# Patient Record
Sex: Female | Born: 1950 | Race: Black or African American | Hispanic: No | Marital: Single | Smoking: Never smoker
Health system: Southern US, Community
[De-identification: ages and names within clinical notes are randomized; demographics above are authoritative.]

## PROBLEM LIST (undated history)

## (undated) DIAGNOSIS — I10 Essential (primary) hypertension: Secondary | ICD-10-CM

## (undated) DIAGNOSIS — Z955 Presence of coronary angioplasty implant and graft: Secondary | ICD-10-CM

## (undated) DIAGNOSIS — K5792 Diverticulitis of intestine, part unspecified, without perforation or abscess without bleeding: Secondary | ICD-10-CM

## (undated) DIAGNOSIS — E7800 Pure hypercholesterolemia, unspecified: Secondary | ICD-10-CM

## (undated) HISTORY — PX: CORONARY ANGIOPLASTY WITH STENT PLACEMENT: SHX49

---

## 2019-03-14 ENCOUNTER — Emergency Department: Payer: Medicare Other

## 2019-03-14 ENCOUNTER — Other Ambulatory Visit: Payer: Self-pay

## 2019-03-14 ENCOUNTER — Emergency Department
Admission: EM | Admit: 2019-03-14 | Discharge: 2019-03-14 | Disposition: A | Discharge status: Home / Self Care | Payer: Medicare Other | Attending: Emergency Medicine | Admitting: Emergency Medicine

## 2019-03-14 DIAGNOSIS — I10 Essential (primary) hypertension: Secondary | ICD-10-CM | POA: Diagnosis not present

## 2019-03-14 DIAGNOSIS — R079 Chest pain, unspecified: Secondary | ICD-10-CM | POA: Diagnosis present

## 2019-03-14 DIAGNOSIS — R109 Unspecified abdominal pain: Secondary | ICD-10-CM | POA: Insufficient documentation

## 2019-03-14 DIAGNOSIS — Z7982 Long term (current) use of aspirin: Secondary | ICD-10-CM | POA: Insufficient documentation

## 2019-03-14 HISTORY — DX: Diverticulitis of intestine, part unspecified, without perforation or abscess without bleeding: K57.92

## 2019-03-14 HISTORY — DX: Pure hypercholesterolemia, unspecified: E78.00

## 2019-03-14 HISTORY — DX: Presence of coronary angioplasty implant and graft: Z95.5

## 2019-03-14 HISTORY — DX: Essential (primary) hypertension: I10

## 2019-03-14 LAB — CBC
HCT: 39.3 % (ref 36.0–46.0)
Hemoglobin: 12.5 g/dL (ref 12.0–15.0)
MCH: 27.9 pg (ref 26.0–34.0)
MCHC: 31.8 g/dL (ref 30.0–36.0)
MCV: 87.7 fL (ref 80.0–100.0)
Platelets: 238 10*3/uL (ref 150–400)
RBC: 4.48 MIL/uL (ref 3.87–5.11)
RDW: 14.9 % (ref 11.5–15.5)
WBC: 12.3 10*3/uL — ABNORMAL HIGH (ref 4.0–10.5)
nRBC: 0 % (ref 0.0–0.2)

## 2019-03-14 LAB — LIPASE, BLOOD: Lipase: 39 U/L (ref 11–51)

## 2019-03-14 LAB — BASIC METABOLIC PANEL
Anion gap: 9 (ref 5–15)
BUN: 43 mg/dL — ABNORMAL HIGH (ref 8–23)
CO2: 25 mmol/L (ref 22–32)
Calcium: 9.4 mg/dL (ref 8.9–10.3)
Chloride: 107 mmol/L (ref 98–111)
Creatinine, Ser: 1.45 mg/dL — ABNORMAL HIGH (ref 0.44–1.00)
GFR calc Af Amer: 43 mL/min — ABNORMAL LOW (ref >60)
GFR calc non Af Amer: 37 mL/min — ABNORMAL LOW (ref >60)
Glucose, Bld: 136 mg/dL — ABNORMAL HIGH (ref 70–99)
Potassium: 3.4 mmol/L — ABNORMAL LOW (ref 3.5–5.1)
Sodium: 141 mmol/L (ref 135–145)

## 2019-03-14 LAB — HEPATIC FUNCTION PANEL
ALT: 41 U/L (ref 0–44)
AST: 44 U/L — ABNORMAL HIGH (ref 15–41)
Albumin: 4 g/dL (ref 3.5–5.0)
Alkaline Phosphatase: 112 U/L (ref 38–126)
Bilirubin, Direct: <0.1 mg/dL (ref 0.0–0.2)
Total Bilirubin: 0.6 mg/dL (ref 0.3–1.2)
Total Protein: 7.8 g/dL (ref 6.5–8.1)

## 2019-03-14 LAB — TROPONIN I
Troponin I: <0.03 ng/mL (ref <0.03)
Troponin I: <0.03 ng/mL (ref <0.03)

## 2019-03-14 IMAGING — CR CHEST - 2 VIEW
1 series · 2 of 2 positions shown · non-contrast
Comparison: None.

CLINICAL DATA: Chest pain.

EXAM:
CHEST - 2 VIEW

[Series 1: dg chest 2 view · 0.14mm/px · 2 of 2 slices shown]
[im 1/2]
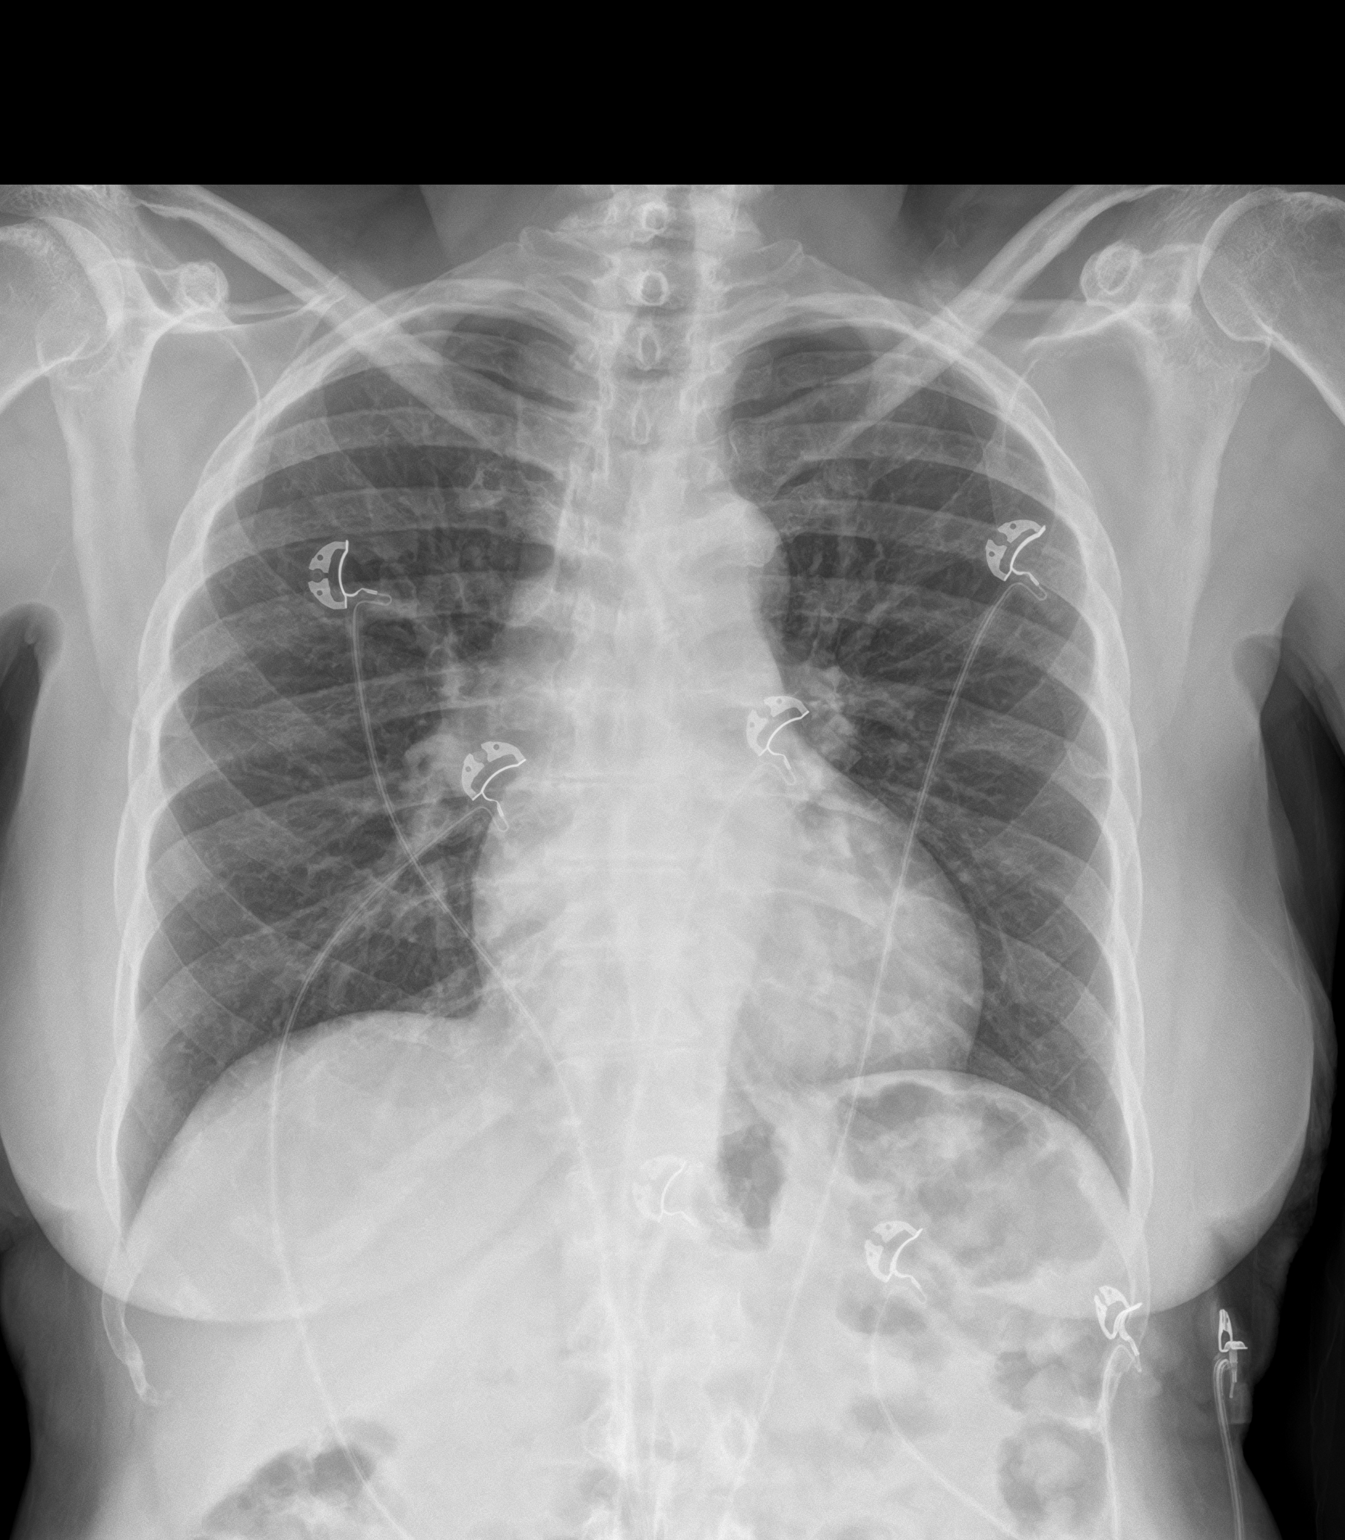
[im 2/2]
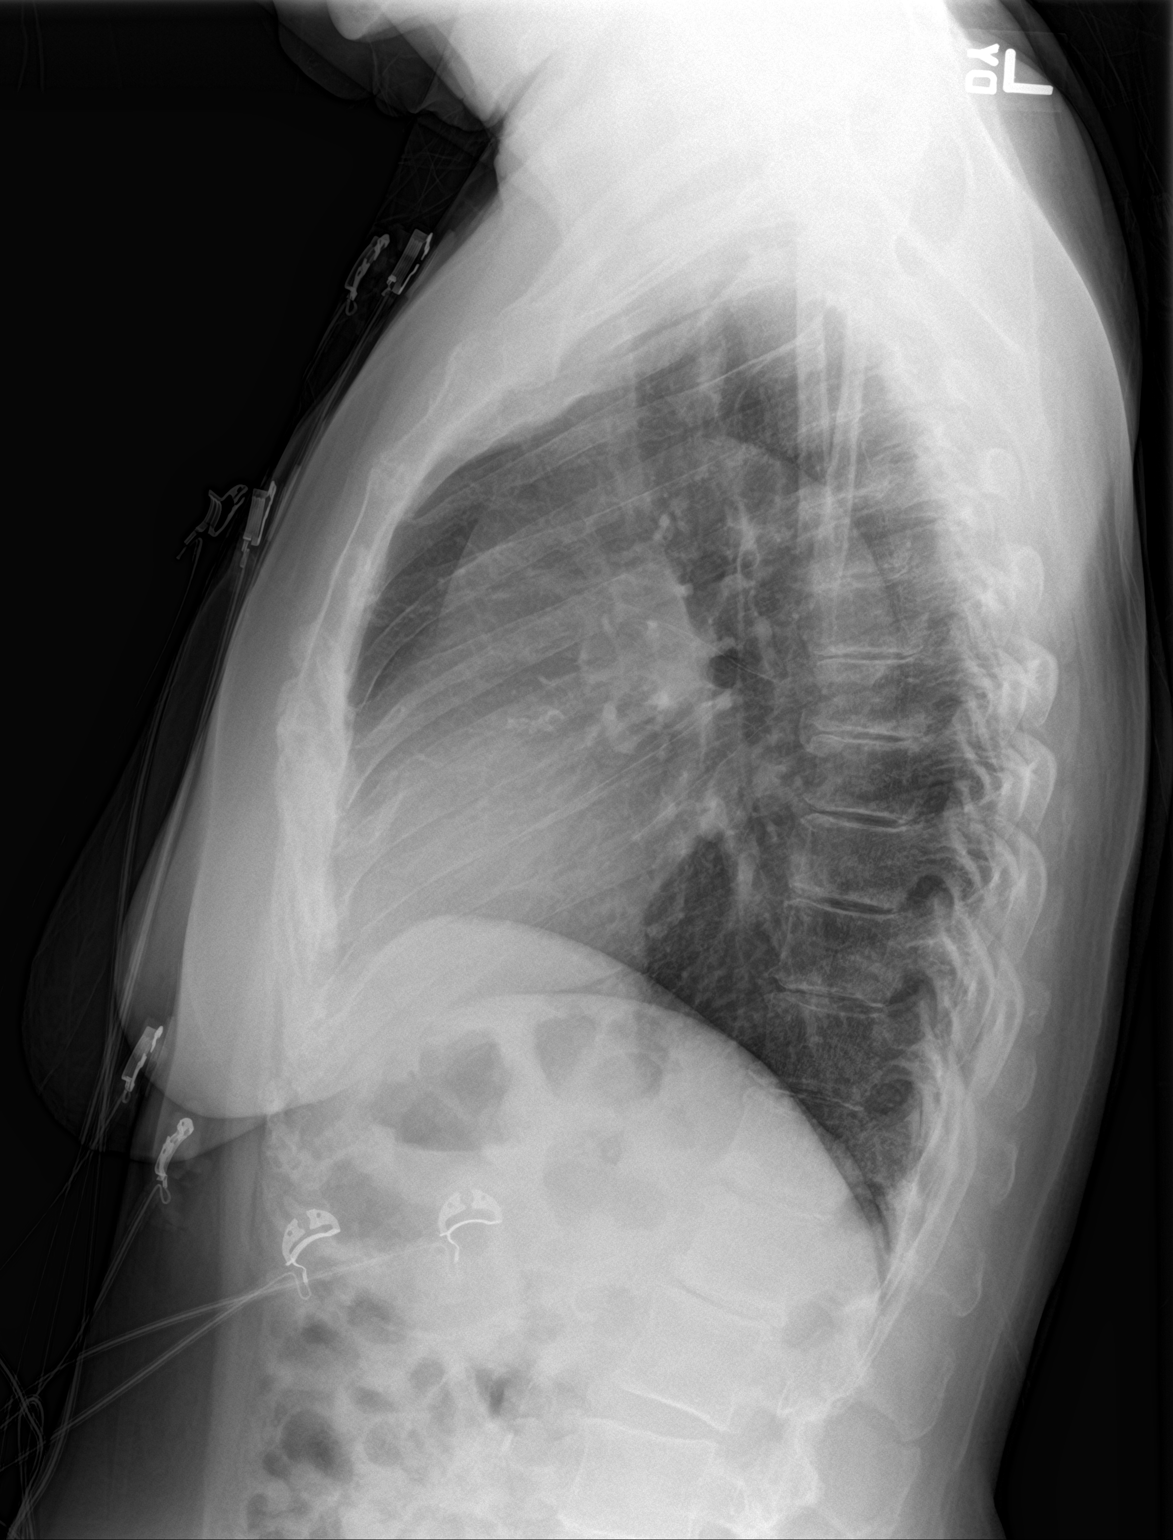

[2 of 2 positions shown; findings below may reference images not displayed]

FINDINGS: The heart size and mediastinal contours are within normal limits.
Both lungs are clear. No pneumothorax or pleural effusion is noted.
The visualized skeletal structures are unremarkable.
IMPRESSION: No active cardiopulmonary disease.

## 2019-03-14 MED ORDER — SODIUM CHLORIDE 0.9 % IV BOLUS
500.0000 mL | Freq: Once | INTRAVENOUS | Status: AC
  Administered 2019-03-14: 10:00:00 500 mL via INTRAVENOUS

## 2019-03-14 NOTE — ED Provider Notes (Signed)
Dr. Gwen Pounds reports he will have clinic follow up with patient and call for appointment on Monday.   Sharyn Creamer, MD 03/14/19 1314

## 2019-03-14 NOTE — ED Notes (Signed)
Pt ambulatory to toilet with steady gait noted.  

## 2019-03-14 NOTE — ED Notes (Signed)
US at bedside

## 2019-03-14 NOTE — ED Triage Notes (Signed)
Pt arrives to ED via [facility] from home for central CP x few weeks. States he has been heavy lifting and doing a lot of yard word. Denies SOB. Denies weight gain or leg swelling. A&O, ambulatory. No distress noted. Hx HTN, eleaveted cholesterol, hx stent placement. Took 4 baby ASA and 1 nitro at home.

## 2019-03-14 NOTE — ED Notes (Signed)
Pt ambulatory to toilet with steady gait.  

## 2019-03-14 NOTE — Discharge Instructions (Addendum)
Please call cardiology whether that is Dr. Philemon Kingdom office or [facility] cardiology on Monday morning to obtain a same-day appointment for follow-up for your chest pain.  You have been seen in the Emergency Department (ED) today for chest pain.  As we have discussed todays test results are normal, but you may require further testing.  Please follow up with the recommended doctor as instructed above in these documents regarding todays emergent visit and your recent symptoms to discuss further management.  Continue to take your regular medications. If you are not doing so already, please also take a daily baby aspirin (81 mg), at least until you follow up with your doctor.  Return to the Emergency Department (ED) if you experience any further chest pain/pressure/tightness, difficulty breathing, or sudden sweating, or other symptoms that concern you.

## 2019-03-14 NOTE — ED Notes (Signed)
Pt returned from xray

## 2019-03-14 NOTE — ED Notes (Signed)
Pt taken to xray via stretcher  

## 2019-03-14 NOTE — ED Notes (Signed)
EDP at bedside  

## 2019-03-14 NOTE — ED Provider Notes (Signed)
[facility] Emergency Department Provider Note   ____________________________________________   First MD Initiated Contact with Patient 03/14/19 [phone]     (approximate)  I have reviewed the triage vital signs and the nursing notes.   HISTORY  Chief Complaint Chest Pain    HPI Good Good is a 68 y.o. female  with a history of hypertension and hypercholesterolemia presents for evaluation of chest pain. Initial onset of pain was approximately 3-6 hours ago. The patient's chest pain is described as heaviness/pressure/tightness, is not worse with exertion and is relieved by nitroglycerin. The patient's chest pain is middle- or left-sided, is not well-localized, is not sharp and does not radiate to the arms/jaw/neck. The patient does not complain of nausea and denies diaphoresis. The patient has no history of stroke, has no history of peripheral artery disease, has not smoked in the past 90 days, denies any history of treated diabetes, has no relevant family history of coronary artery disease (first degree relative at less than age 9) and does not have an elevated BMI (>=30).     Patient reports of pain was first noticed about 2 weeks ago while gardening.  His over become intermittent.  It was fairly severe at about 2 AM last night, started while she is getting ready for bed.  It caused significant pain, a dull achiness but some slight sharp component as well.  She reports that it subsided after taking aspirin 325 mg and one nitroglycerin tablet she had at home before EMS arrived.  Reports the present time the pain is gone.  She denies any abdominal pain.  However she has noticed at times it feels like the pain seems slightly worse after she eats something, but she not having the pain now.  She still eating and drinking normally and denies abdominal pain.  No nausea or vomiting.  No sweating.  Receives [facility] care at [facility]  Past Medical History:  Diagnosis  Date  . Diverticulitis   . H/O right coronary artery stent placement   . High cholesterol   . Hypertension     There are no active problems to display for this patient.   History reviewed. No pertinent surgical history.  Prior to Admission medications   Medication Sig Start Date End Date Taking? Authorizing Provider  acetaminophen (TYLENOL) 500 MG tablet Take 1,000 mg by mouth every 6 (six) hours as needed. 07/18/17  Yes [provider]  aspirin EC 81 MG tablet Take 81 mg by mouth daily. 02/15/17  Yes [provider]  atenolol-chlorthalidone (TENORETIC) 100-25 MG tablet Take 1 tablet by mouth daily. 03/09/19  Yes [provider]  atorvastatin (LIPITOR) 40 MG tablet Take 40 mg by mouth every evening. 03/12/19  Yes [provider]  enalapril (VASOTEC) 20 MG tablet Take 20 mg by mouth 2 (two) times a day. 03/09/19  Yes [provider]  nitroGLYCERIN (NITROSTAT) 0.4 MG SL tablet Place 0.4 mg under the tongue as needed. 03/15/15  Yes [provider]    Allergies Metformin  History reviewed. No pertinent family history.  Social History Social History   Tobacco Use  . Smoking status: Never Smoker  Substance Use Topics  . Alcohol use: Not Currently  . Drug use: Not on file    Review of Systems Constitutional: No fever/chills Eyes: No visual changes. ENT: No sore throat. Cardiovascular: See HPI Respiratory: Denies shortness of breath. Gastrointestinal: No abdominal pain.  Sometimes the pain feels worse after eating. Genitourinary: Negative  for dysuria. Musculoskeletal: Negative for back pain. Skin: Negative for rash. Neurological: Negative for headaches, areas of focal weakness or numbness.    ____________________________________________   PHYSICAL EXAM:  VITAL SIGNS: ED Triage Vitals  Enc Vitals Group     BP 03/14/19 0741 134/66     Pulse --      Resp 03/14/19 0741 14     Temp --      Temp Source 03/14/19 0741 Oral      SpO2 --      Weight 03/14/19 0738 135 lb (61.2 kg)     Height 03/14/19 0738 5\' 9"  (1.753 m)     Head Circumference --      Peak Flow --      Pain Score 03/14/19 0738 6     Pain Loc --      Pain Edu? --      Excl. in GC? --     Constitutional: Alert and oriented. Well appearing and in no acute distress.  She is very pleasant. Eyes: Conjunctivae are normal. Head: Atraumatic. Nose: No congestion/rhinnorhea. Mouth/Throat: Mucous membranes are moist. Neck: No stridor.  Cardiovascular: Normal rate, regular rhythm. Grossly normal heart sounds.  Good peripheral circulation. Respiratory: Normal respiratory effort.  No retractions. Lungs CTAB. Gastrointestinal: Soft and nontender. No distention. Musculoskeletal: No lower extremity tenderness nor edema. Neurologic:  Normal speech and language. No gross focal neurologic deficits are appreciated.  Skin:  Skin is warm, dry and intact. No rash noted. Psychiatric: Mood and affect are normal. Speech and behavior are normal.  ____________________________________________   LABS (all labs ordered are listed, but only abnormal results are displayed)  Labs Reviewed  BASIC METABOLIC PANEL - Abnormal; Notable for the following components:      Result Value   Potassium 3.4 (*)    Glucose, Bld 136 (*)    BUN 43 (*)    Creatinine, Ser 1.45 (*)    GFR calc non Af Amer 37 (*)    GFR calc Af Amer 43 (*)    All other components within normal limits  CBC - Abnormal; Notable for the following components:   WBC 12.3 (*)    All other components within normal limits  HEPATIC FUNCTION PANEL - Abnormal; Notable for the following components:   AST 44 (*)    All other components within normal limits  TROPONIN I  LIPASE, BLOOD  TROPONIN I   ____________________________________________  EKG  Reviewed and interpreted by me at 7:40 AM There is no prior EKG for easy comparison  in our system Heart rate 50 QRS 90 QTc 460 Normal sinus rhythm, left  ventricular hypertrophy suspected, fairly deep T wave inversions laterally and also some both the precordial and limb leads.  Difficult to exclude ischemia , of note the patient is pain-free at the time of this EKG.  Repeat EKG performed, patient remains pain-free no acute changes. ____________________________________________  RADIOLOGY  Dg Chest 2 View  Result Date: 03/14/2019 CLINICAL DATA:  Chest pain. EXAM: CHEST - 2 VIEW COMPARISON:  None. FINDINGS: The heart size and mediastinal contours are within normal limits. Both lungs are clear. No pneumothorax or pleural effusion is noted. The visualized skeletal structures are unremarkable. IMPRESSION: No active cardiopulmonary disease. Electronically Signed   By: Lupita RaiderJames  Green Jr M.D.   On: 03/14/2019 08:23   [place] Abdomen Limited Ruq  Result Date: 03/14/2019 CLINICAL DATA:  Right upper quadrant abdominal pain for several weeks. EXAM: ULTRASOUND ABDOMEN LIMITED RIGHT UPPER QUADRANT COMPARISON:  None. FINDINGS: Gallbladder: No gallstones or wall thickening visualized. No sonographic Murphy sign noted by sonographer. Common bile duct: Diameter: 3 mm Liver: No focal lesion identified. Within normal limits in parenchymal echogenicity. Portal vein is patent on color Doppler imaging with normal direction of blood flow towards the liver. IMPRESSION: 1. Unremarkable sonographic appearance of the hepatobiliary system. Electronically Signed   By: Gaylyn Rong M.D.   On: 03/14/2019 10:08   Chest x-ray reviewed as well as right upper quadrant also both negative for pathology ____________________________________________   PROCEDURES  Procedure(s) performed: None  Procedures  Critical Care performed: No  ____________________________________________   INITIAL IMPRESSION / ASSESSMENT AND PLAN / ED COURSE  Pertinent labs & imaging results that were available during my care of the patient were reviewed by me and considered in my medical decision making  (see chart for details).   Differential diagnosis includes, but is not limited to, ACS, aortic dissection, pulmonary embolism, cardiac tamponade, pneumothorax, pneumonia, pericarditis, myocarditis, GI-related causes including esophagitis/gastritis, and musculoskeletal chest wall pain.  Patient's heart score before troponin is elevated moderately at 6.  Her history is mixed between some concerning and less concerning components, and the length of symptoms intermittently over a couple weeks time worsening does concern me as well.  She is currently pain-free.  I will observe her closely, check a troponin.  Also plan to discuss with [facility] cardiology as we do not have previous EKG for comparison to obtain information from her cardiology team as to their recommendations for care once initial testing is back.   LARESA OSHIRO was evaluated in Emergency Department on 03/14/2019 for the symptoms described in the history of present illness. She was evaluated in the context of the global COVID-19 pandemic, which necessitated consideration that the patient might be at risk for infection with the SARS-CoV-2 virus that causes COVID-19. Institutional protocols and algorithms that pertain to the evaluation of patients at risk for COVID-19 are in a state of rapid change based on information released by regulatory bodies including the CDC and federal and state organizations. These policies and algorithms were followed during the patient's care in the ED.  Patient symptoms do not appear to be consistent with coronavirus disease.  She denies any fevers or recent illness.  She has been sheltering at home.  She has no flulike or infectious symptoms denoted.   Clinical Course as of Mar 13 1236  Sat Mar 14, 2019  0804 No tachycardia, no hypoxia.  The patient denies any history of ripping tearing or moving pain.  She is not notably hypertensive.  She denies any history of leg swelling, no recent surgeries, no blood clots.  She  reports the symptoms remind her somewhat of her symptoms she was talked to by her doctor about her stents in the past, believes that those were placed in about 2006.   [MQ]  [phone] Patient remains asymptomatic.  Creatinine slightly elevated, baseline not quite clear.  Have paged [facility] cardiology who the patient identifies as her primary cardiologist at this time to discuss her EKG, presentation and symptoms.   [MQ]  1003 [facility] cardiology was not available for consultation today due to coronavirus.  I have paged unassigned cardiology to discuss.   [MQ]  1031 Case and care discussed with Dr. Gwen Pounds. EKGs, clinical history reviewed.    [MQ]  1040 Had a extensive conversation with the patient, also spoke with her daughter Hilbert Odor via phone, and we discussed that she does have some concerning chest  pain and that my recommendation would be for an hospital observation although alternatively with her concerns about coronavirus it would not be unreasonable for her to go home if she had a plan to stay with her daughter which she is, and to call 911 right away if she had concerns like shortness of breath or recurrent chest pain.  Cardiology advises they can follow-up with her and see her Monday and she is agreeable with this plan to.  With shared medical decision making, discussion of her risk for being moderate for possibly having a heart attack in the next few days, and the patient would like for [place] to check her second blood test and if this is normal as she is no longer having pain she would like to likely go home and follow-up Monday.   [MQ]    Clinical Course User Index [MQ] Sharyn Creamer, MD   ----------------------------------------- 12:36 PM on 03/14/2019 -----------------------------------------   Patient remains pain-free.  She wishes to be discharged and I have talked to cardiology and she will follow-up Monday with either our cardiology team or hers at [facility].  Vitals:   03/14/19 0900 03/14/19 1100   BP: 132/67 135/67  Pulse: (!) 56 (!) 50  Resp: 20 14  Temp:    SpO2: 99% 100%   Cardiology had advised consideration of beta-blocker at discharge, but she does have some slight bradycardia and we will avoid beta blockade at this point and rather have her follow-up Monday with cardiology which she is certainly agreeable to do. ____________________________________________   FINAL CLINICAL IMPRESSION(S) / ED DIAGNOSES  Final diagnoses:  Abdominal pain  Chest pain, moderate coronary artery risk        Note:  This document was prepared using Dragon voice recognition software and may include unintentional dictation errors       Sharyn Creamer, MD 03/14/19 1237

## 2019-03-14 NOTE — ED Notes (Signed)
Daughter- Jamesetta Pietri [phone]

## 2021-12-22 ENCOUNTER — Encounter: Payer: Self-pay | Admitting: Emergency Medicine

## 2021-12-22 ENCOUNTER — Emergency Department: Payer: Medicare PPO

## 2021-12-22 ENCOUNTER — Telehealth: Payer: Self-pay | Admitting: Emergency Medicine

## 2021-12-22 ENCOUNTER — Other Ambulatory Visit: Payer: Self-pay

## 2021-12-22 ENCOUNTER — Emergency Department
Admission: EM | Admit: 2021-12-22 | Discharge: 2021-12-22 | Disposition: A | Discharge status: Home / Self Care | Payer: Medicare PPO | Attending: Emergency Medicine | Admitting: Emergency Medicine

## 2021-12-22 DIAGNOSIS — K5792 Diverticulitis of intestine, part unspecified, without perforation or abscess without bleeding: Secondary | ICD-10-CM | POA: Insufficient documentation

## 2021-12-22 DIAGNOSIS — Z7982 Long term (current) use of aspirin: Secondary | ICD-10-CM | POA: Diagnosis not present

## 2021-12-22 DIAGNOSIS — K529 Noninfective gastroenteritis and colitis, unspecified: Secondary | ICD-10-CM | POA: Diagnosis not present

## 2021-12-22 DIAGNOSIS — Z79899 Other long term (current) drug therapy: Secondary | ICD-10-CM | POA: Insufficient documentation

## 2021-12-22 DIAGNOSIS — D72829 Elevated white blood cell count, unspecified: Secondary | ICD-10-CM | POA: Insufficient documentation

## 2021-12-22 DIAGNOSIS — Z20822 Contact with and (suspected) exposure to covid-19: Secondary | ICD-10-CM | POA: Insufficient documentation

## 2021-12-22 DIAGNOSIS — I2510 Atherosclerotic heart disease of native coronary artery without angina pectoris: Secondary | ICD-10-CM | POA: Insufficient documentation

## 2021-12-22 DIAGNOSIS — I10 Essential (primary) hypertension: Secondary | ICD-10-CM | POA: Diagnosis not present

## 2021-12-22 DIAGNOSIS — R109 Unspecified abdominal pain: Secondary | ICD-10-CM | POA: Diagnosis present

## 2021-12-22 LAB — COMPREHENSIVE METABOLIC PANEL
ALT: 40 U/L (ref 0–44)
AST: 40 U/L (ref 15–41)
Albumin: 3.6 g/dL (ref 3.5–5.0)
Alkaline Phosphatase: 108 U/L (ref 38–126)
Anion gap: 6 (ref 5–15)
BUN: 32 mg/dL — ABNORMAL HIGH (ref 8–23)
CO2: 25 mmol/L (ref 22–32)
Calcium: 9.1 mg/dL (ref 8.9–10.3)
Chloride: 107 mmol/L (ref 98–111)
Creatinine, Ser: 1.5 mg/dL — ABNORMAL HIGH (ref 0.44–1.00)
GFR, Estimated: 37 mL/min — ABNORMAL LOW (ref >60)
Glucose, Bld: 144 mg/dL — ABNORMAL HIGH (ref 70–99)
Potassium: 3.4 mmol/L — ABNORMAL LOW (ref 3.5–5.1)
Sodium: 138 mmol/L (ref 135–145)
Total Bilirubin: 0.6 mg/dL (ref 0.3–1.2)
Total Protein: 7.6 g/dL (ref 6.5–8.1)

## 2021-12-22 LAB — URINALYSIS, ROUTINE W REFLEX MICROSCOPIC
Bacteria, UA: NONE SEEN
Bilirubin Urine: NEGATIVE
Glucose, UA: NEGATIVE mg/dL
Ketones, ur: NEGATIVE mg/dL
Leukocytes,Ua: NEGATIVE
Nitrite: NEGATIVE
Protein, ur: NEGATIVE mg/dL
Specific Gravity, Urine: 1.03 (ref 1.005–1.030)
pH: 5 (ref 5.0–8.0)

## 2021-12-22 LAB — CBC WITH DIFFERENTIAL/PLATELET
Abs Immature Granulocytes: 0.06 10*3/uL (ref 0.00–0.07)
Basophils Absolute: 0.1 10*3/uL (ref 0.0–0.1)
Basophils Relative: 0 %
Eosinophils Absolute: 0.5 10*3/uL (ref 0.0–0.5)
Eosinophils Relative: 3 %
HCT: 40.5 % (ref 36.0–46.0)
Hemoglobin: 12.6 g/dL (ref 12.0–15.0)
Immature Granulocytes: 0 %
Lymphocytes Relative: 12 %
Lymphs Abs: 1.9 10*3/uL (ref 0.7–4.0)
MCH: 27.4 pg (ref 26.0–34.0)
MCHC: 31.1 g/dL (ref 30.0–36.0)
MCV: 88 fL (ref 80.0–100.0)
Monocytes Absolute: 1.3 10*3/uL — ABNORMAL HIGH (ref 0.1–1.0)
Monocytes Relative: 8 %
Neutro Abs: 12 10*3/uL — ABNORMAL HIGH (ref 1.7–7.7)
Neutrophils Relative %: 77 %
Platelets: 270 10*3/uL (ref 150–400)
RBC: 4.6 MIL/uL (ref 3.87–5.11)
RDW: 15 % (ref 11.5–15.5)
WBC: 15.8 10*3/uL — ABNORMAL HIGH (ref 4.0–10.5)
nRBC: 0 % (ref 0.0–0.2)

## 2021-12-22 LAB — RESP PANEL BY RT-PCR (FLU A&B, COVID) ARPGX2
Influenza A by PCR: NEGATIVE
Influenza B by PCR: NEGATIVE
SARS Coronavirus 2 by RT PCR: NEGATIVE

## 2021-12-22 LAB — TROPONIN I (HIGH SENSITIVITY)
Troponin I (High Sensitivity): 12 ng/L (ref <18)
Troponin I (High Sensitivity): 11 ng/L (ref <18)

## 2021-12-22 LAB — LIPASE, BLOOD: Lipase: 44 U/L (ref 11–51)

## 2021-12-22 MED ORDER — IOHEXOL 300 MG/ML  SOLN
75.0000 mL | Freq: Once | INTRAMUSCULAR | Status: AC | PRN
  Administered 2021-12-22: 75 mL via INTRAVENOUS

## 2021-12-22 MED ORDER — ONDANSETRON HCL 4 MG PO TABS: 4.0000 mg | ORAL_TABLET | Freq: Every day | ORAL | 0 refills | Status: AC | PRN

## 2021-12-22 MED ORDER — SODIUM CHLORIDE 0.9 % IV SOLN: INTRAVENOUS | Status: DC

## 2021-12-22 MED ORDER — HYDROCODONE-ACETAMINOPHEN 5-325 MG PO TABS: 1.0000 | ORAL_TABLET | Freq: Four times a day (QID) | ORAL | 0 refills | Status: AC | PRN

## 2021-12-22 MED ORDER — ONDANSETRON HCL 4 MG/2ML IJ SOLN
4.0000 mg | Freq: Once | INTRAMUSCULAR | Status: AC
  Administered 2021-12-22: 4 mg via INTRAVENOUS
  Filled 2021-12-22: qty 2

## 2021-12-22 MED ORDER — AMOXICILLIN-POT CLAVULANATE 875-125 MG PO TABS
1.0000 | ORAL_TABLET | Freq: Once | ORAL | Status: AC
  Administered 2021-12-22: 1 via ORAL
  Filled 2021-12-22: qty 1

## 2021-12-22 MED ORDER — HYDROCODONE-ACETAMINOPHEN 5-325 MG PO TABS
1.0000 | ORAL_TABLET | ORAL | 0 refills | Status: AC | PRN
Start: 2021-12-22 — End: 2022-12-22

## 2021-12-22 MED ORDER — MORPHINE SULFATE (PF) 4 MG/ML IV SOLN
4.0000 mg | Freq: Once | INTRAVENOUS | Status: AC
  Administered 2021-12-22: 4 mg via INTRAVENOUS
  Filled 2021-12-22: qty 1

## 2021-12-22 MED ORDER — ONDANSETRON 4 MG PO TBDP: 4.0000 mg | ORAL_TABLET | Freq: Four times a day (QID) | ORAL | 0 refills | Status: AC | PRN

## 2021-12-22 NOTE — Discharge Instructions (Addendum)

## 2021-12-22 NOTE — ED Provider Notes (Signed)
[facility] Provider Note    Event Date/Time   First MD Initiated Contact with Patient 12/22/21 [phone]     (approximate)   History   Abdominal Pain   HPI  Dawn Good is a 71 y.o. female with history of CAD, hypertension, hyperlipidemia, diverticulosis and recurrent diverticulitis who presents to the emergency department with abdominal pain for the past week and a half that worsened tonight with nausea and vomiting.  No fevers but has had chills.  No diarrhea, bloody stools or melena.  No dysuria, hematuria, vaginal bleeding or discharge.  States she did have a previous abdominal surgery when she was 71 years old but is not able to tell me what was done.  She states this feels like her diverticulitis but normally her diverticulitis causes left lower quadrant pain and today she is having epigastric pain and right lower quadrant pain.  She denies history of appendectomy, cholecystectomy.   History provided by patient.    Past Medical History:  Diagnosis Date   Diverticulitis    H/O right coronary artery stent placement    High cholesterol    Hypertension     Past Surgical History:  Procedure Laterality Date   CORONARY ANGIOPLASTY WITH STENT PLACEMENT      MEDICATIONS:  Prior to Admission medications   Medication Sig Start Date End Date Taking? Authorizing Provider  acetaminophen (TYLENOL) 500 MG tablet Take 1,000 mg by mouth every 6 (six) hours as needed. 07/18/17   [provider]  aspirin EC 81 MG tablet Take 81 mg by mouth daily. 02/15/17   [provider]  atenolol-chlorthalidone (TENORETIC) 100-25 MG tablet Take 1 tablet by mouth daily. 03/09/19   [provider]  atorvastatin (LIPITOR) 40 MG tablet Take 40 mg by mouth every evening. 03/12/19   [provider]  enalapril (VASOTEC) 20 MG tablet Take 20 mg by mouth 2 (two) times a day. 03/09/19   [provider]  nitroGLYCERIN (NITROSTAT) 0.4 MG SL tablet  Place 0.4 mg under the tongue as needed. 03/15/15   [provider]    Physical Exam   Triage Vital Signs: ED Triage Vitals  Enc Vitals Group     BP 12/22/21 0029 (!) 156/70     Pulse Rate 12/22/21 0029 65     Resp 12/22/21 0029 20     Temp 12/22/21 0029 99.1 F (37.3 C)     Temp Source 12/22/21 0029 Oral     SpO2 12/22/21 0029 99 %     Weight 12/22/21 0019 130 lb (59 kg)     Height 12/22/21 0019 5\' 6"  (1.676 m)     Head Circumference --      Peak Flow --      Pain Score 12/22/21 0018 9     Pain Loc --      Pain Edu? --      Excl. in [place]? --     Most recent vital signs: Vitals:   12/22/21 0515 12/22/21 0530  BP:  (!) 108/59  Pulse: 61 64  Resp: 18 (!) 23  Temp:  98.9 F (37.2 C)  SpO2: 99% 99%    CONSTITUTIONAL: Alert and oriented and responds appropriately to questions. Well-appearing; well-nourished HEAD: Normocephalic, atraumatic EYES: Conjunctivae clear, pupils appear equal, sclera nonicteric ENT: normal nose; moist mucous membranes NECK: Supple, normal ROM CARD: RRR; S1 and S2 appreciated; no murmurs, no clicks, no rubs, no gallops RESP: Normal chest excursion without splinting or tachypnea;  breath sounds clear and equal bilaterally; no wheezes, no rhonchi, no rales, no hypoxia or respiratory distress, speaking full sentences ABD/GI: Normal bowel sounds; non-distended; soft, tender to palpation diffusely but worse in the right lower quadrant with voluntary guarding, no rebound BACK: The back appears normal EXT: Normal ROM in all joints; no deformity noted, no edema; no cyanosis SKIN: Normal color for age and race; warm; no rash on exposed skin NEURO: Moves all extremities equally, normal speech PSYCH: The patient's mood and manner are appropriate.   ED Results / Procedures / Treatments   LABS: (all labs ordered are listed, but only abnormal results are displayed) Labs Reviewed  CBC WITH DIFFERENTIAL/PLATELET - Abnormal; Notable for the following  components:      Result Value   WBC 15.8 (*)    Neutro Abs 12.0 (*)    Monocytes Absolute 1.3 (*)    All other components within normal limits  COMPREHENSIVE METABOLIC PANEL - Abnormal; Notable for the following components:   Potassium 3.4 (*)    Glucose, Bld 144 (*)    BUN 32 (*)    Creatinine, Ser 1.50 (*)    GFR, Estimated 37 (*)    All other components within normal limits  URINALYSIS, ROUTINE W REFLEX MICROSCOPIC - Abnormal; Notable for the following components:   Color, Urine YELLOW (*)    APPearance CLEAR (*)    Hgb urine dipstick MODERATE (*)    All other components within normal limits  RESP PANEL BY RT-PCR (FLU A&B, COVID) ARPGX2  LIPASE, BLOOD  TROPONIN I (HIGH SENSITIVITY)  TROPONIN I (HIGH SENSITIVITY)     EKG:  EKG Interpretation  Date/Time:  Friday December 22 2021 00:25:09 EST Ventricular Rate:  68 PR Interval:  160 QRS Duration: 80 QT Interval:  424 QTC Calculation: 450 R Axis:   61 Text Interpretation: Normal sinus rhythm Marked ST abnormality, possible inferior subendocardial injury Abnormal ECG No significant change since last tracing in 2020 Confirmed by Konner Warrior, Baxter Hire [phone]) on 12/22/2021 3:42:27 AM         RADIOLOGY: My personal review and interpretation of imaging: CT scan shows normal appendix.  Findings of enteritis and right diverticulitis present.  I have personally reviewed all radiology reports.   CT ABDOMEN PELVIS W CONTRAST  Result Date: 12/22/2021 CLINICAL DATA:  Right lower quadrant abdominal pain EXAM: CT ABDOMEN AND PELVIS WITH CONTRAST TECHNIQUE: Multidetector CT imaging of the abdomen and pelvis was performed using the standard protocol following bolus administration of intravenous contrast. RADIATION DOSE REDUCTION: This exam was performed according to the departmental dose-optimization program which includes automated exposure control, adjustment of the mA and/or kV according to patient size and/or use of iterative reconstruction  technique. CONTRAST:  66mL OMNIPAQUE IOHEXOL 300 MG/ML  SOLN COMPARISON:  None. FINDINGS: Lower chest: Lung bases are clear. Hepatobiliary: Subcentimeter hepatic cysts. Gallbladder is unremarkable. No intrahepatic or extrahepatic ductal dilatation. Pancreas: Within normal limits. Spleen: Within normal limits. Adrenals/Urinary Tract: Adrenal glands are within normal limits. 0.0 cm cyst in the left lower kidney (series 7/image 14), benign. Right kidney is within normal limits. No hydronephrosis. Bladder is partially obscured by streak artifact but grossly unremarkable. Stomach/Bowel: Stomach is notable for a tiny hiatal hernia. no evidence of bowel obstruction. Large duodenal diverticuli in the left mid abdomen (coronal image 24). Normal appendix (series 2/image 42). Scattered colonic diverticulosis with wall thickening involving a loop of distal ileum (coronal image 63) and trace fluid in the right pericolic gutter (series 2/image 50). Adjacent  scattered right colonic diverticulosis. This is poorly evaluated given lack of intraluminal contrast but favors infectious/inflammatory enteritis over right colonic diverticulitis. Additional sigmoid diverticulosis, without associated inflammatory changes. Vascular/Lymphatic: No evidence of abdominal aortic aneurysm. Atherosclerotic calcifications of the abdominal aorta and branch vessels. No suspicious abdominopelvic lymphadenopathy. Reproductive: Status post hysterectomy. No adnexal masses. Other: Small volume pelvic ascites. No free air. Musculoskeletal: Right hip arthroplasty, without evidence of complication. Mild degenerative changes of the visualized thoracolumbar spine. IMPRESSION: No evidence of bowel obstruction.  Normal appendix. Inflammatory changes/fluid in the ileocecal region, favoring infectious/inflammatory enteritis, less likely right colonic diverticulitis. Small volume pelvic ascites. No free air. Additional ancillary findings as above. Electronically  Signed   By: Julian Hy M.D.   On: 12/22/2021 03:41     PROCEDURES:  Critical Care performed: No   CRITICAL CARE Performed by: Cyril Mourning Christain Mcraney   Total critical care time: 0 minutes  Critical care time was exclusive of separately billable procedures and treating other patients.  Critical care was necessary to treat or prevent imminent or life-threatening deterioration.  Critical care was time spent personally by me on the following activities: development of treatment plan with patient and/or surrogate as well as nursing, discussions with consultants, evaluation of patient's response to treatment, examination of patient, obtaining history from patient or surrogate, ordering and performing treatments and interventions, ordering and review of laboratory studies, ordering and review of radiographic studies, pulse oximetry and re-evaluation of patient's condition.   Marland Kitchen1-3 Lead EKG Interpretation Performed by: Shimon Trowbridge, Delice Bison, DO Authorized by: Keyah Blizard, Delice Bison, DO     Interpretation: normal     ECG rate:  70   ECG rate assessment: normal     Rhythm: sinus rhythm     Ectopy: none     Conduction: normal      IMPRESSION / MDM / ASSESSMENT AND PLAN / ED COURSE  I reviewed the triage vital signs and the nursing notes.    Patient here with complaints of abdominal pain and vomiting.  Pain worsening over the past week and a half.  The patient is on the cardiac monitor to evaluate for evidence of arrhythmia and/or significant heart rate changes.   DIFFERENTIAL DIAGNOSIS (includes but not limited to):   Appendicitis, cholecystitis, biliary colic, pancreatitis, cholangitis, choledocholithiasis, diverticulitis, colitis, bowel obstruction, UTI, kidney stone, pyelonephritis, viral gastroenteritis   PLAN: We will obtain CBC, CMP, lipase, urine, CT of the abdomen pelvis.  EKG was obtained from triage given she did have some epigastric pain but shows no new ischemic abnormality.  Troponin  is also pending.  She denies chest pain or shortness of breath today.   MEDICATIONS GIVEN IN ED: Medications  0.9 %  sodium chloride infusion (0 mLs Intravenous Stopped 12/22/21 0527)  morphine (PF) 4 MG/ML injection 4 mg (4 mg Intravenous Given 12/22/21 0257)  ondansetron (ZOFRAN) injection 4 mg (4 mg Intravenous Given 12/22/21 0257)  iohexol (OMNIPAQUE) 300 MG/ML solution 75 mL (75 mLs Intravenous Contrast Given 12/22/21 0323)  amoxicillin-clavulanate (AUGMENTIN) 875-125 MG per tablet 1 tablet (1 tablet Oral Given 12/22/21 0356)     ED COURSE: Labs show leukocytosis of 15.8 with left shift.  She has slightly elevated kidney function which appears stable compared to previous.  Normal LFTs, lipase.  Troponin x2 negative.  CT of the abdomen pelvis reviewed by myself and radiologist shows enteritis, possible right colonic diverticulitis without complication.  No appendicitis seen.  Urine shows no sign of infection.  Patient reports feeling better and is  tolerating p.o.  Will discharge with Augmentin and pain and nausea medicine.  She is comfortable with this plan.  Discussed supportive care instructions, eating a bland diet and return precautions.  Patient comfortable with plan for discharge home.  At this time, I do not feel there is any life-threatening condition present. I reviewed all nursing notes, vitals, pertinent previous records.  All lab and urine results, EKGs, imaging ordered have been independently reviewed and interpreted by myself.  I reviewed all available radiology reports from any imaging ordered this visit.  Based on my assessment, I feel the patient is safe to be discharged home without further emergent workup and can continue workup as an outpatient as needed. Discussed all findings, treatment plan as well as usual and customary return precautions with patient.  They verbalize understanding and are comfortable with this plan.  Outpatient follow-up has been provided as needed.  All  questions have been answered.    CONSULTS: Admission was considered given patient's age and history of diverticulitis but given no complications seen on CT imaging and that she was hemodynamically stable without signs of sepsis and was tolerating p.o., decision was made to discharge home with oral medications and close outpatient follow-up.   OUTSIDE RECORDS REVIEWED: Reviewed patient's last internal medicine note at [facility] on 05/17/2021.         FINAL CLINICAL IMPRESSION(S) / ED DIAGNOSES   Final diagnoses:  Enteritis  Diverticulitis     Rx / DC Orders   ED Discharge Orders          Ordered    ondansetron (ZOFRAN-ODT) 4 MG disintegrating tablet  Every 6 hours PRN        12/22/21 0516    HYDROcodone-acetaminophen (NORCO/VICODIN) 5-325 MG tablet  Every 6 hours PRN        12/22/21 0516             Note:  This document was prepared using Dragon voice recognition software and may include unintentional dictation errors.   Iolani Twilley, Delice Bison, DO 12/22/21 [phone]

## 2021-12-22 NOTE — ED Notes (Signed)
Pt given water for PO challenge 

## 2021-12-22 NOTE — ED Triage Notes (Signed)
Patient ambulatory to triage with steady gait, without difficulty or distress noted; pt reports mid upper abd pain accomp by N/V/D x wk; st hx of same with diverticulosis

## 2021-12-22 NOTE — Telephone Encounter (Signed)
[facility] is unable to fill Norco due to shortage.  I have called the prescription into [facility].

## 2023-08-30 ENCOUNTER — Emergency Department
Admission: EM | Admit: 2023-08-30 | Discharge: 2023-08-30 | Disposition: A | Discharge status: Home / Self Care | Payer: Medicare PPO | Attending: Emergency Medicine | Admitting: Emergency Medicine

## 2023-08-30 ENCOUNTER — Other Ambulatory Visit: Payer: Self-pay

## 2023-08-30 ENCOUNTER — Emergency Department: Payer: Medicare PPO

## 2023-08-30 DIAGNOSIS — R42 Dizziness and giddiness: Secondary | ICD-10-CM | POA: Insufficient documentation

## 2023-08-30 DIAGNOSIS — R0602 Shortness of breath: Secondary | ICD-10-CM | POA: Diagnosis present

## 2023-08-30 DIAGNOSIS — I2510 Atherosclerotic heart disease of native coronary artery without angina pectoris: Secondary | ICD-10-CM | POA: Insufficient documentation

## 2023-08-30 DIAGNOSIS — I10 Essential (primary) hypertension: Secondary | ICD-10-CM | POA: Diagnosis not present

## 2023-08-30 LAB — CBC
HCT: 37 % (ref 36.0–46.0)
Hemoglobin: 11.9 g/dL — ABNORMAL LOW (ref 12.0–15.0)
MCH: 28.6 pg (ref 26.0–34.0)
MCHC: 32.2 g/dL (ref 30.0–36.0)
MCV: 88.9 fL (ref 80.0–100.0)
Platelets: 208 10*3/uL (ref 150–400)
RBC: 4.16 MIL/uL (ref 3.87–5.11)
RDW: 15.3 % (ref 11.5–15.5)
WBC: 8.8 10*3/uL (ref 4.0–10.5)
nRBC: 0 % (ref 0.0–0.2)

## 2023-08-30 LAB — BASIC METABOLIC PANEL
Anion gap: 7 (ref 5–15)
BUN: 29 mg/dL — ABNORMAL HIGH (ref 8–23)
CO2: 27 mmol/L (ref 22–32)
Calcium: 8.8 mg/dL — ABNORMAL LOW (ref 8.9–10.3)
Chloride: 107 mmol/L (ref 98–111)
Creatinine, Ser: 1.22 mg/dL — ABNORMAL HIGH (ref 0.44–1.00)
GFR, Estimated: 47 mL/min — ABNORMAL LOW (ref >60)
Glucose, Bld: 116 mg/dL — ABNORMAL HIGH (ref 70–99)
Potassium: 3.4 mmol/L — ABNORMAL LOW (ref 3.5–5.1)
Sodium: 141 mmol/L (ref 135–145)

## 2023-08-30 LAB — TROPONIN I (HIGH SENSITIVITY)
Troponin I (High Sensitivity): 12 ng/L (ref <18)
Troponin I (High Sensitivity): 8 ng/L (ref <18)

## 2023-08-30 NOTE — ED Triage Notes (Signed)
Pt arrived via EMS from home. Pt was outside working in her flower bed when she started to be SOB.

## 2023-08-30 NOTE — Discharge Instructions (Signed)
You were seen in the emergency department today for evaluation of your shortness of breath and lightheadedness.  Your testing was overall reassuring.  Please follow-up closely with your primary care doctor for further evaluation.  Make sure you are eating regular meals and staying hydrated.  Return to the ER for new or worsening symptoms.

## 2023-08-30 NOTE — ED Provider Notes (Signed)
[facility] Provider Note    Event Date/Time   First MD Initiated Contact with Patient 08/30/23 1643     (approximate)   History   Shortness of Breath   HPI  Dawn Good is a 72 year old female with history of CAD, HTN, HLD presenting to the emergency department for evaluation of shortness of breath and lightheadedness.  Patient was working in her garden which she does frequently when she began to have some shortness of breath.  Has happened to her previously and she has been instructed to take deep breaths and to take an additional dose of her atenolol.  She did this but continued to have some shortness of breath as well as some lightheadedness.  Denies spinning sensation.  No syncope or near syncope.  Called her daughter who called 911.  Reports her vitals were fine with EMS, but was brought in for further evaluation.  Feels improved at the time of my evaluation.  Reports she ate breakfast normally this morning, but is not very good about drinking water.     Physical Exam   Triage Vital Signs: ED Triage Vitals [08/30/23 1312]  Encounter Vitals Group     BP (!) 150/75     Systolic BP Percentile      Diastolic BP Percentile      Pulse Rate 63     Resp 17     Temp 98.4 F (36.9 C)     Temp src      SpO2 100 %     Weight      Height      Head Circumference      Peak Flow      Pain Score      Pain Loc      Pain Education      Exclude from Growth Chart     Most recent vital signs: Vitals:   08/30/23 1312 08/30/23 1703  BP: (!) 150/75 (!) 167/82  Pulse: 63 61  Resp: 17 16  Temp: 98.4 F (36.9 C)   SpO2: 100% 100%     General: Awake, interactive  CV:  Regular rate, good peripheral perfusion.  Resp:  Unlabored respirations, lungs clear to auscultation Abd:  Soft, nontender, nondistended Neuro:  Alert and oriented, normal extraocular movements, symmetric facial movement, sensation intact over bilateral upper and lower extremities with 5  out of 5 strength.  Able to ambulate with steady gait without recurrent lightheadedness   ED Results / Procedures / Treatments   Labs (all labs ordered are listed, but only abnormal results are displayed) Labs Reviewed  BASIC METABOLIC PANEL - Abnormal; Notable for the following components:      Result Value   Potassium 3.4 (*)    Glucose, Bld 116 (*)    BUN 29 (*)    Creatinine, Ser 1.22 (*)    Calcium 8.8 (*)    GFR, Estimated 47 (*)    All other components within normal limits  CBC - Abnormal; Notable for the following components:   Hemoglobin 11.9 (*)    All other components within normal limits  TROPONIN I (HIGH SENSITIVITY)  TROPONIN I (HIGH SENSITIVITY)     EKG EKG independently reviewed interpreted by myself (ER attending) demonstrates:  EKG demonstrates sinus rhythm at a rate of 62, PR 154, QRS 78, QTc 424, ST depressions with T wave inversions noted in multiple leads, similar to prior from 12/22/2021  RADIOLOGY Imaging independently reviewed and interpreted by myself demonstrates:  CXR without focal consolidation  PROCEDURES:  Critical Care performed: No  Procedures   MEDICATIONS ORDERED IN ED: Medications - No data to display   IMPRESSION / MDM / ASSESSMENT AND PLAN / ED COURSE  I reviewed the triage vital signs and the nursing notes.  Differential diagnosis includes, but is not limited to, pneumonia, pneumothorax, ACS, anemia, electrolyte abnormality, dehydration  Patient's presentation is most consistent with acute presentation with potential threat to life or bodily function.  72 year old female presenting to the emergency department for evaluation of lightheadedness and shortness of breath.  At the time of my initial evaluation, patient has 2 L of nasal cannula on that she reports was placed by EMS, but says that they told her vitals were reassuring.  She is satting 100% and I was able to wean this to room air and she continued to maintain her sat at  100%.  I had her ambulate around the emergency department without desaturations development of shortness of breath, or lightheadedness.  Labs sent from triage with mild anemia with hemoglobin of 11.9, but not significantly changed from prior.  Stable renal impairment.  No critical derangements.  Troponin x 2 negative.  Chest x-Jehiel Koepp reassuring.  EKG stable.  Patient had been in the ER for a few hours at time of my initial evaluation.  She reported feeling much improved and was eager to be discharged home.  Do think this is reasonable given her reassuring workup and well appearance.  Did discuss the importance of regular meals and staying hydrated.  Strict return precautions provided.  She will follow-up closely with her primary care doctor.  Patient discharged in stable condition.      FINAL CLINICAL IMPRESSION(S) / ED DIAGNOSES   Final diagnoses:  SOB (shortness of breath)  Lightheadedness     Rx / DC Orders   ED Discharge Orders     None        Note:  This document was prepared using Dragon voice recognition software and may include unintentional dictation errors.   Trinna Post, MD 08/30/23 [phone]

## 2023-09-07 ENCOUNTER — Other Ambulatory Visit: Payer: Self-pay

## 2023-09-07 ENCOUNTER — Emergency Department: Payer: Medicare PPO

## 2023-09-07 ENCOUNTER — Emergency Department
Admission: EM | Admit: 2023-09-07 | Discharge: 2023-09-07 | Disposition: A | Discharge status: Home / Self Care | Payer: Medicare PPO | Attending: Emergency Medicine | Admitting: Emergency Medicine

## 2023-09-07 DIAGNOSIS — Z955 Presence of coronary angioplasty implant and graft: Secondary | ICD-10-CM | POA: Insufficient documentation

## 2023-09-07 DIAGNOSIS — R0602 Shortness of breath: Secondary | ICD-10-CM | POA: Insufficient documentation

## 2023-09-07 DIAGNOSIS — I10 Essential (primary) hypertension: Secondary | ICD-10-CM | POA: Insufficient documentation

## 2023-09-07 DIAGNOSIS — I2510 Atherosclerotic heart disease of native coronary artery without angina pectoris: Secondary | ICD-10-CM | POA: Diagnosis not present

## 2023-09-07 DIAGNOSIS — R42 Dizziness and giddiness: Secondary | ICD-10-CM | POA: Diagnosis present

## 2023-09-07 LAB — BASIC METABOLIC PANEL
Anion gap: 11 (ref 5–15)
BUN: 26 mg/dL — ABNORMAL HIGH (ref 8–23)
CO2: 23 mmol/L (ref 22–32)
Calcium: 9.3 mg/dL (ref 8.9–10.3)
Chloride: 105 mmol/L (ref 98–111)
Creatinine, Ser: 1.18 mg/dL — ABNORMAL HIGH (ref 0.44–1.00)
GFR, Estimated: 49 mL/min — ABNORMAL LOW (ref >60)
Glucose, Bld: 144 mg/dL — ABNORMAL HIGH (ref 70–99)
Potassium: 3.3 mmol/L — ABNORMAL LOW (ref 3.5–5.1)
Sodium: 139 mmol/L (ref 135–145)

## 2023-09-07 LAB — CBC
HCT: 38.5 % (ref 36.0–46.0)
Hemoglobin: 12.4 g/dL (ref 12.0–15.0)
MCH: 28.7 pg (ref 26.0–34.0)
MCHC: 32.2 g/dL (ref 30.0–36.0)
MCV: 89.1 fL (ref 80.0–100.0)
Platelets: 254 10*3/uL (ref 150–400)
RBC: 4.32 MIL/uL (ref 3.87–5.11)
RDW: 15.2 % (ref 11.5–15.5)
WBC: 9.4 10*3/uL (ref 4.0–10.5)
nRBC: 0 % (ref 0.0–0.2)

## 2023-09-07 LAB — TROPONIN I (HIGH SENSITIVITY): Troponin I (High Sensitivity): 14 ng/L (ref <18)

## 2023-09-07 NOTE — ED Notes (Addendum)
Printed out EKG and handed to [employer]. Had done repeat EKG because pt appeared to be having ST depression on central monitor. Printed 12 lead does not show EKG.

## 2023-09-07 NOTE — ED Notes (Signed)
Called lab to add on troponin  

## 2023-09-07 NOTE — ED Notes (Signed)
Cyril Loosen, MD discontinued repeat trop.

## 2023-09-07 NOTE — ED Triage Notes (Signed)
Pt comes via EMs from home with c/o sob. Pt was at store and got sob and then went home and started to get dizzy. Pt seen for same thing week ago. VSS pt does have some anxiety.

## 2023-09-07 NOTE — ED Provider Notes (Signed)
[facility] Provider Note    Event Date/Time   First MD Initiated Contact with Patient 09/07/23 1302     (approximate)   History   Shortness of Breath   HPI  Dawn Good is a 72 y.o. female with a history of CAD, hypertension who presents after an episode of shortness of breath and dizziness, now resolved.  She reports is similar to what happened a week ago for she was evaluated the emergency department.  She is asymptomatic at this time.  She does have a history of coronary stents but has not seen a cardiologist in the last 10 years.  She adamantly denies chest pain, no diaphoresis.  No nausea or vomiting.  No neurodeficits     Physical Exam   Triage Vital Signs: ED Triage Vitals  Encounter Vitals Group     BP 09/07/23 1214 (!) 147/69     Systolic BP Percentile --      Diastolic BP Percentile --      Pulse Rate 09/07/23 1214 65     Resp 09/07/23 1214 (!) 21     Temp 09/07/23 1214 98 F (36.7 C)     Temp src --      SpO2 09/07/23 1214 100 %     Weight --      Height --      Head Circumference --      Peak Flow --      Pain Score 09/07/23 1202 0     Pain Loc --      Pain Education --      Exclude from Growth Chart --     Most recent vital signs: Vitals:   09/07/23 1214  BP: (!) 147/69  Pulse: 65  Resp: (!) 21  Temp: 98 F (36.7 C)  SpO2: 100%     General: Awake, no distress.  CV:  Good peripheral perfusion.  Regular rate and rhythm, no murmur Resp:  Normal effort.  Clear to auscultation bilaterally Abd:  No distention.  Soft, nontender Other:  Normal neurologic exam cranial nerves are normal No calf pain or swelling  ED Results / Procedures / Treatments   Labs (all labs ordered are listed, but only abnormal results are displayed) Labs Reviewed  BASIC METABOLIC PANEL - Abnormal; Notable for the following components:      Result Value   Potassium 3.3 (*)    Glucose, Bld 144 (*)    BUN 26 (*)    Creatinine, Ser 1.18 (*)     GFR, Estimated 49 (*)    All other components within normal limits  CBC  TROPONIN I (HIGH SENSITIVITY)     EKG  ED ECG REPORT I, Jene Every, the attending physician, personally viewed and interpreted this ECG.  Date: 09/07/2023  Rhythm: normal sinus rhythm QRS Axis: normal Intervals: normal ST/T Wave abnormalities: Nonspecific changes Narrative Interpretation: no evidence of acute ischemia    RADIOLOGY Chest x-ray viewed interpret by me, no acute abnormality CT head without acute findings    PROCEDURES:  Critical Care performed:   Procedures   MEDICATIONS ORDERED IN ED: Medications - No data to display   IMPRESSION / MDM / ASSESSMENT AND PLAN / ED COURSE  I reviewed the triage vital signs and the nursing notes. Patient's presentation is most consistent with acute presentation with potential threat to life or bodily function.  Patient presents with dizziness as described above, she describes this as lightheadedness, now improved.  Differential is  extensive.  She denies chest pain to suggest any sort of coronary syndrome.  No fevers or chills, possibly mildly dehydrated  Work reviewed and overall reassuring, mild elevation of BUN/creatinine ratio.  High sensitive troponin is normal.  Chest x-ray is unremarkable  Considered admission however given the patient is asymptomatic and feeling well, feel outpatient follow-up is appropriate, I have placed a cardiology referral, she agrees with this plan, return precautions discussed.        FINAL CLINICAL IMPRESSION(S) / ED DIAGNOSES   Final diagnoses:  Dizziness     Rx / DC Orders   ED Discharge Orders          Ordered    Ambulatory referral to Cardiology       Comments: If you have not heard from the Cardiology office within the next 72 hours please call [phone].   09/07/23 1509             Note:  This document was prepared using Dragon voice recognition software and may include  unintentional dictation errors.   Jene Every, MD 09/07/23 1525

## 2024-03-17 ENCOUNTER — Emergency Department
Admission: EM | Admit: 2024-03-17 | Discharge: 2024-03-17 | Disposition: A | Discharge status: Home / Self Care | Attending: Emergency Medicine | Admitting: Emergency Medicine

## 2024-03-17 ENCOUNTER — Encounter: Payer: Self-pay | Admitting: Emergency Medicine

## 2024-03-17 ENCOUNTER — Emergency Department

## 2024-03-17 ENCOUNTER — Other Ambulatory Visit: Payer: Self-pay

## 2024-03-17 DIAGNOSIS — I10 Essential (primary) hypertension: Secondary | ICD-10-CM | POA: Diagnosis not present

## 2024-03-17 DIAGNOSIS — I2510 Atherosclerotic heart disease of native coronary artery without angina pectoris: Secondary | ICD-10-CM | POA: Insufficient documentation

## 2024-03-17 DIAGNOSIS — K529 Noninfective gastroenteritis and colitis, unspecified: Secondary | ICD-10-CM | POA: Diagnosis not present

## 2024-03-17 DIAGNOSIS — K5712 Diverticulitis of small intestine without perforation or abscess without bleeding: Secondary | ICD-10-CM | POA: Diagnosis not present

## 2024-03-17 DIAGNOSIS — R1031 Right lower quadrant pain: Secondary | ICD-10-CM

## 2024-03-17 LAB — COMPREHENSIVE METABOLIC PANEL WITH GFR
ALT: 33 U/L (ref 0–44)
AST: 52 U/L — ABNORMAL HIGH (ref 15–41)
Albumin: 3.9 g/dL (ref 3.5–5.0)
Alkaline Phosphatase: 104 U/L (ref 38–126)
Anion gap: 11 (ref 5–15)
BUN: 40 mg/dL — ABNORMAL HIGH (ref 8–23)
CO2: 24 mmol/L (ref 22–32)
Calcium: 9.7 mg/dL (ref 8.9–10.3)
Chloride: 104 mmol/L (ref 98–111)
Creatinine, Ser: 1.57 mg/dL — ABNORMAL HIGH (ref 0.44–1.00)
GFR, Estimated: 35 mL/min — ABNORMAL LOW (ref >60)
Glucose, Bld: 175 mg/dL — ABNORMAL HIGH (ref 70–99)
Potassium: 4.1 mmol/L (ref 3.5–5.1)
Sodium: 139 mmol/L (ref 135–145)
Total Bilirubin: 1.2 mg/dL (ref 0.0–1.2)
Total Protein: 8.3 g/dL — ABNORMAL HIGH (ref 6.5–8.1)

## 2024-03-17 LAB — CBC WITH DIFFERENTIAL/PLATELET
Abs Immature Granulocytes: 0.07 10*3/uL (ref 0.00–0.07)
Basophils Absolute: 0.1 10*3/uL (ref 0.0–0.1)
Basophils Relative: 0 %
Eosinophils Absolute: 0.2 10*3/uL (ref 0.0–0.5)
Eosinophils Relative: 2 %
HCT: 48.7 % — ABNORMAL HIGH (ref 36.0–46.0)
Hemoglobin: 15.5 g/dL — ABNORMAL HIGH (ref 12.0–15.0)
Immature Granulocytes: 0 %
Lymphocytes Relative: 9 %
Lymphs Abs: 1.5 10*3/uL (ref 0.7–4.0)
MCH: 27.3 pg (ref 26.0–34.0)
MCHC: 31.8 g/dL (ref 30.0–36.0)
MCV: 85.9 fL (ref 80.0–100.0)
Monocytes Absolute: 1 10*3/uL (ref 0.1–1.0)
Monocytes Relative: 6 %
Neutro Abs: 13 10*3/uL — ABNORMAL HIGH (ref 1.7–7.7)
Neutrophils Relative %: 83 %
Platelets: 284 10*3/uL (ref 150–400)
RBC: 5.67 MIL/uL — ABNORMAL HIGH (ref 3.87–5.11)
RDW: 15.5 % (ref 11.5–15.5)
WBC: 15.8 10*3/uL — ABNORMAL HIGH (ref 4.0–10.5)
nRBC: 0 % (ref 0.0–0.2)

## 2024-03-17 LAB — URINALYSIS, W/ REFLEX TO CULTURE (INFECTION SUSPECTED)
Bilirubin Urine: NEGATIVE
Glucose, UA: >=500 mg/dL — AB
Ketones, ur: NEGATIVE mg/dL
Nitrite: NEGATIVE
Protein, ur: NEGATIVE mg/dL
Specific Gravity, Urine: 1.021 (ref 1.005–1.030)
pH: 5 (ref 5.0–8.0)

## 2024-03-17 LAB — LIPASE, BLOOD: Lipase: 41 U/L (ref 11–51)

## 2024-03-17 LAB — TROPONIN I (HIGH SENSITIVITY)
Troponin I (High Sensitivity): 17 ng/L (ref <18)
Troponin I (High Sensitivity): 19 ng/L — ABNORMAL HIGH (ref <18)

## 2024-03-17 MED ORDER — ACETAMINOPHEN 325 MG PO TABS
650.0000 mg | ORAL_TABLET | Freq: Once | ORAL | Status: AC
Start: 2024-03-17 — End: 2024-03-17
  Administered 2024-03-17: 650 mg via ORAL
  Filled 2024-03-17: qty 2

## 2024-03-17 MED ORDER — ONDANSETRON HCL 4 MG/2ML IJ SOLN
4.0000 mg | Freq: Once | INTRAMUSCULAR | Status: AC
  Administered 2024-03-17: 4 mg via INTRAVENOUS
  Filled 2024-03-17: qty 2

## 2024-03-17 MED ORDER — MORPHINE SULFATE (PF) 2 MG/ML IV SOLN
2.0000 mg | Freq: Once | INTRAVENOUS | Status: AC
  Administered 2024-03-17: 2 mg via INTRAVENOUS
  Filled 2024-03-17: qty 1

## 2024-03-17 MED ORDER — AMOXICILLIN-POT CLAVULANATE 875-125 MG PO TABS: 1.0000 | ORAL_TABLET | Freq: Two times a day (BID) | ORAL | 0 refills | Status: AC

## 2024-03-17 MED ORDER — IOHEXOL 300 MG/ML  SOLN
100.0000 mL | Freq: Once | INTRAMUSCULAR | Status: AC | PRN
  Administered 2024-03-17: 100 mL via INTRAVENOUS

## 2024-03-17 MED ORDER — AMOXICILLIN-POT CLAVULANATE 875-125 MG PO TABS
1.0000 | ORAL_TABLET | Freq: Once | ORAL | Status: AC
  Administered 2024-03-17: 1 via ORAL
  Filled 2024-03-17: qty 1

## 2024-03-17 NOTE — ED Notes (Signed)
Pt returns from radiology. 

## 2024-03-17 NOTE — ED Notes (Signed)
 ED Provider at bedside.

## 2024-03-17 NOTE — Discharge Instructions (Addendum)
 Take antibiotics for full 5 days as prescribed.   See your doctor this week for a follow up appointment.   Thank you for choosing us  for your health care today!  Please see your primary doctor this week for a follow up appointment.   If you have any new, worsening, or unexpected symptoms call your doctor right away or come back to the emergency department for reevaluation.  It was my pleasure to care for you today.   Arron Large Margery Sheets, MD

## 2024-03-17 NOTE — ED Provider Notes (Signed)
 [facility] Provider Note    Event Date/Time   First MD Initiated Contact with Patient 03/17/24 0406     (approximate)   History   Abdominal Pain and Shortness of Breath   HPI  Dawn Good is a 73 y.o. female   Past medical history of CAD, hypertension hyperlipidemia, history of diverticulitis who presents to Emergency Department with right lower quadrant pain.  Started tonight around 8 PM.  No radiation of pain.  Has had loose stools but no GI bleeding.  Was nauseated earlier.  No urinary symptoms.  Feels that when the pain spikes she takes her breath away.  Currently no shortness of breath or chest pain.  No respiratory infectious symptoms.   External Medical Documents Reviewed: Cardiology office visit in March 2025      Physical Exam   Triage Vital Signs: ED Triage Vitals  Encounter Vitals Group     BP 03/17/24 0404 (!) 150/70     Systolic BP Percentile --      Diastolic BP Percentile --      Pulse Rate 03/17/24 0404 65     Resp 03/17/24 0404 18     Temp 03/17/24 0404 98.5 F (36.9 C)     Temp Source 03/17/24 0404 Oral     SpO2 03/17/24 0404 100 %     Weight 03/17/24 0405 126 lb (57.2 kg)     Height 03/17/24 0405 5\' 8"  (1.727 m)     Head Circumference --      Peak Flow --      Pain Score 03/17/24 0405 8     Pain Loc --      Pain Education --      Exclude from Growth Chart --     Most recent vital signs: Vitals:   03/17/24 0404 03/17/24 0550  BP: (!) 150/70 (!) 101/57  Pulse: 65 62  Resp: 18 16  Temp: 98.5 F (36.9 C) 98.2 F (36.8 C)  SpO2: 100% 100%    General: Awake, no distress.  CV:  Good peripheral perfusion.  Resp:  Normal effort.  Abd:  No distention.  Other:  She has right lower quadrant tenderness to palpation without rigidity or guarding.  The other quadrants of the abdomen are benign.  Clear lungs to auscultation without focality or wheezing.  Slightly hypertensive otherwise vital signs normal. No hypoxemia  or respiratory distress.   ED Results / Procedures / Treatments   Labs (all labs ordered are listed, but only abnormal results are displayed) Labs Reviewed  COMPREHENSIVE METABOLIC PANEL WITH GFR - Abnormal; Notable for the following components:      Result Value   Glucose, Bld 175 (*)    BUN 40 (*)    Creatinine, Ser 1.57 (*)    Total Protein 8.3 (*)    AST 52 (*)    GFR, Estimated 35 (*)    All other components within normal limits  CBC WITH DIFFERENTIAL/PLATELET - Abnormal; Notable for the following components:   WBC 15.8 (*)    RBC 5.67 (*)    Hemoglobin 15.5 (*)    HCT 48.7 (*)    Neutro Abs 13.0 (*)    All other components within normal limits  URINALYSIS, W/ REFLEX TO CULTURE (INFECTION SUSPECTED) - Abnormal; Notable for the following components:   Color, Urine YELLOW (*)    APPearance HAZY (*)    Glucose, UA >=500 (*)    Hgb urine dipstick MODERATE (*)  Leukocytes,Ua SMALL (*)    Bacteria, UA RARE (*)    All other components within normal limits  TROPONIN I (HIGH SENSITIVITY) - Abnormal; Notable for the following components:   Troponin I (High Sensitivity) 19 (*)    All other components within normal limits  LIPASE, BLOOD  TROPONIN I (HIGH SENSITIVITY)     I ordered and reviewed the above labs they are notable for leukocytosis  EKG  ED ECG REPORT I, Buell Carmin, the attending physician, personally viewed and interpreted this ECG.   Date: 03/17/2024  EKG Time: 0404  Rate: 67  Rhythm: sinus  Axis: nl  Intervals:nl  ST&T Change: std inferolateral unchanged from prior ecgs    RADIOLOGY I independently reviewed and interpreted chest x-ray and I see no obvious focality pneumothorax I also reviewed radiologist's formal read.   PROCEDURES:  Critical Care performed: No  Procedures   MEDICATIONS ORDERED IN ED: Medications  morphine  (PF) 2 MG/ML injection 2 mg (2 mg Intravenous Given 03/17/24 0414)  acetaminophen  (TYLENOL ) tablet 650 mg (650 mg Oral  Given 03/17/24 0415)  ondansetron  (ZOFRAN ) injection 4 mg (4 mg Intravenous Given 03/17/24 0414)  iohexol  (OMNIPAQUE ) 300 MG/ML solution 100 mL (100 mLs Intravenous Contrast Given 03/17/24 0526)  amoxicillin -clavulanate (AUGMENTIN ) 875-125 MG per tablet 1 tablet (1 tablet Oral Given 03/17/24 1610)    External physician / consultants:  I spoke with Dr. Charmel Cooter of general surgery regarding care plan for this patient.   IMPRESSION / MDM / ASSESSMENT AND PLAN / ED COURSE  I reviewed the triage vital signs and the nursing notes.                                Patient's presentation is most consistent with acute presentation with potential threat to life or bodily function.  Differential diagnosis includes, but is not limited to, acute appendicitis, diverticulitis, perforated viscus, biliary colic/cholecystitis, obstruction, renal colic, gut ischemia, considered but less likely ACS PE or respiratory infection   The patient is on the cardiac monitor to evaluate for evidence of arrhythmia and/or significant heart rate changes.  MDM:    Right lower quadrant tenderness to palpation acute onset tonight concerning for acute abdominal pathology is better assessed with CT scan which I have ordered.  Antiemetic and pain control ordered as well.  She had some shortness of breath associated with pain but none currently and no respiratory infectious symptoms or chest pain doubt cardiopulmonary emergencies at this time.   -- CT scan with inflammatory changes reviewed with Dr. Charmel Cooter of general surgery given the CT read with multiple possible findings including low-grade SBO, enteritis, small bowel diverticulitis, hyperenhancement of the appendix.  Dr. Charmel Cooter thinks most likely inflammation related to enteritis.  No evidence of appendicitis.  Patient reexamined and stable, and will be given antibiotics to take as an outpatient and follow-up with PMD.  She understands to return to the emergency department  should she develop any new or worsening symptoms.     FINAL CLINICAL IMPRESSION(S) / ED DIAGNOSES   Final diagnoses:  RLQ abdominal pain  Enteritis  Diverticulitis of small intestine without perforation or abscess without bleeding     Rx / DC Orders   ED Discharge Orders          Ordered    amoxicillin -clavulanate (AUGMENTIN ) 875-125 MG tablet  2 times daily        03/17/24 0703  Note:  This document was prepared using Dragon voice recognition software and may include unintentional dictation errors.    Buell Carmin, MD 03/17/24 [phone]

## 2024-03-17 NOTE — ED Notes (Signed)
 Pt to radiology at this time.

## 2024-03-17 NOTE — ED Triage Notes (Signed)
 Pt arrives via ems c/o chronic RUQ pain that intermittently flares up. Pt reports flare up started around 8 pm accompanied w/ sob which prompted pt to call ems. Pt endorses nausea. Pt reports hx of diverticulitis.
